# Patient Record
Sex: Female | Born: 1980 | Race: Black or African American | Hispanic: No | Marital: Single | Smoking: Never smoker
Health system: Southern US, Community
[De-identification: ages and names within clinical notes are randomized; demographics above are authoritative.]

## PROBLEM LIST (undated history)

## (undated) DIAGNOSIS — E669 Obesity, unspecified: Secondary | ICD-10-CM

## (undated) DIAGNOSIS — Z789 Other specified health status: Secondary | ICD-10-CM

## (undated) HISTORY — PX: DILATION AND CURETTAGE OF UTERUS: SHX78

## (undated) HISTORY — DX: Obesity, unspecified: E66.9

## (undated) HISTORY — PX: NO PAST SURGERIES: SHX2092

---

## 2009-09-10 ENCOUNTER — Emergency Department (HOSPITAL_COMMUNITY): Admission: EM | Admit: 2009-09-10 | Discharge: 2009-09-10 | Payer: Self-pay | Admitting: Emergency Medicine

## 2010-08-24 LAB — URINALYSIS, ROUTINE W REFLEX MICROSCOPIC
Glucose, UA: NEGATIVE mg/dL
Hgb urine dipstick: NEGATIVE
Protein, ur: NEGATIVE mg/dL
Specific Gravity, Urine: 1.023 (ref 1.005–1.030)
pH: 7 (ref 5.0–8.0)

## 2010-08-24 LAB — WET PREP, GENITAL: Yeast Wet Prep HPF POC: NONE SEEN

## 2010-08-24 LAB — GC/CHLAMYDIA PROBE AMP, GENITAL: GC Probe Amp, Genital: NEGATIVE

## 2010-12-27 ENCOUNTER — Emergency Department (HOSPITAL_COMMUNITY)
Admission: EM | Admit: 2010-12-27 | Discharge: 2010-12-27 | Disposition: A | Discharge status: Home / Self Care | Payer: Medicaid Other | Attending: Emergency Medicine | Admitting: Emergency Medicine

## 2010-12-27 DIAGNOSIS — R358 Other polyuria: Secondary | ICD-10-CM | POA: Insufficient documentation

## 2010-12-27 DIAGNOSIS — Z3201 Encounter for pregnancy test, result positive: Secondary | ICD-10-CM | POA: Insufficient documentation

## 2010-12-27 DIAGNOSIS — O210 Mild hyperemesis gravidarum: Secondary | ICD-10-CM | POA: Insufficient documentation

## 2010-12-27 DIAGNOSIS — R3589 Other polyuria: Secondary | ICD-10-CM | POA: Insufficient documentation

## 2010-12-27 LAB — URINALYSIS, ROUTINE W REFLEX MICROSCOPIC
Bilirubin Urine: NEGATIVE
Glucose, UA: NEGATIVE mg/dL
Nitrite: NEGATIVE
Specific Gravity, Urine: 1.034 — ABNORMAL HIGH (ref 1.005–1.030)
pH: 5.5 (ref 5.0–8.0)

## 2010-12-27 LAB — POCT PREGNANCY, URINE: Preg Test, Ur: POSITIVE

## 2011-01-03 ENCOUNTER — Encounter (HOSPITAL_COMMUNITY): Payer: Self-pay

## 2011-03-03 LAB — HEPATITIS B SURFACE ANTIGEN: Hepatitis B Surface Ag: NEGATIVE

## 2011-03-03 LAB — RUBELLA ANTIBODY, IGM: Rubella: IMMUNE

## 2011-03-03 LAB — HIV ANTIBODY (ROUTINE TESTING W REFLEX): HIV: NONREACTIVE

## 2011-03-03 LAB — ABO/RH: RH Type: POSITIVE

## 2011-04-21 ENCOUNTER — Other Ambulatory Visit (HOSPITAL_COMMUNITY): Payer: Self-pay | Admitting: Obstetrics and Gynecology

## 2011-04-21 DIAGNOSIS — Z0489 Encounter for examination and observation for other specified reasons: Secondary | ICD-10-CM

## 2011-05-02 ENCOUNTER — Other Ambulatory Visit (HOSPITAL_COMMUNITY): Payer: Self-pay

## 2011-05-03 ENCOUNTER — Other Ambulatory Visit (HOSPITAL_COMMUNITY): Payer: Self-pay | Admitting: Obstetrics and Gynecology

## 2011-05-03 ENCOUNTER — Ambulatory Visit (HOSPITAL_COMMUNITY)
Admission: RE | Admit: 2011-05-03 | Discharge: 2011-05-03 | Disposition: A | Discharge status: Home / Self Care | Payer: Medicaid Other | Source: Ambulatory Visit | Attending: Obstetrics and Gynecology | Admitting: Obstetrics and Gynecology

## 2011-05-03 ENCOUNTER — Encounter (HOSPITAL_COMMUNITY): Payer: Self-pay

## 2011-05-03 DIAGNOSIS — E669 Obesity, unspecified: Secondary | ICD-10-CM | POA: Insufficient documentation

## 2011-05-03 DIAGNOSIS — Z09 Encounter for follow-up examination after completed treatment for conditions other than malignant neoplasm: Secondary | ICD-10-CM

## 2011-05-03 DIAGNOSIS — O358XX0 Maternal care for other (suspected) fetal abnormality and damage, not applicable or unspecified: Secondary | ICD-10-CM | POA: Insufficient documentation

## 2011-05-03 DIAGNOSIS — Z363 Encounter for antenatal screening for malformations: Secondary | ICD-10-CM | POA: Insufficient documentation

## 2011-05-03 DIAGNOSIS — Z0489 Encounter for examination and observation for other specified reasons: Secondary | ICD-10-CM

## 2011-05-03 DIAGNOSIS — Z1389 Encounter for screening for other disorder: Secondary | ICD-10-CM | POA: Insufficient documentation

## 2011-05-09 ENCOUNTER — Other Ambulatory Visit: Payer: Self-pay

## 2011-06-06 NOTE — L&D Delivery Note (Signed)
Delivery Note At 8:46 AM a viable female was delivered via Vaginal, Spontaneous Delivery (Presentation: Left Occiput Anterior).  APGAR: 8, 9; weight 5 lb 10.2 oz (2557 g).   Placenta status: Intact, Spontaneous.  Cord: 3 vessels with the following complications: None, routine cord blood collected  Anesthesia: Epidural  Episiotomy: None Lacerations: None Suture Repair: none Est. Blood Loss (mL): 150  Mom to postpartum.  Baby to nursery-stable Dr Estanislado Pandy notified.  Emmely Bittinger M 07/10/2011, 9:08 AM

## 2011-06-08 ENCOUNTER — Ambulatory Visit (HOSPITAL_COMMUNITY)
Admission: RE | Admit: 2011-06-08 | Discharge: 2011-06-08 | Disposition: A | Discharge status: Home / Self Care | Payer: Medicaid Other | Source: Ambulatory Visit | Attending: Obstetrics and Gynecology | Admitting: Obstetrics and Gynecology

## 2011-06-08 DIAGNOSIS — Z3689 Encounter for other specified antenatal screening: Secondary | ICD-10-CM | POA: Insufficient documentation

## 2011-06-08 DIAGNOSIS — Z0489 Encounter for examination and observation for other specified reasons: Secondary | ICD-10-CM

## 2011-06-08 DIAGNOSIS — E669 Obesity, unspecified: Secondary | ICD-10-CM | POA: Insufficient documentation

## 2011-06-08 NOTE — Progress Notes (Signed)
Obstetric ultrasound performed today.  Please see report in [facility]. 

## 2011-06-27 ENCOUNTER — Encounter (HOSPITAL_COMMUNITY): Payer: Self-pay

## 2011-06-27 ENCOUNTER — Inpatient Hospital Stay (HOSPITAL_COMMUNITY)
Admission: AD | Admit: 2011-06-27 | Discharge: 2011-06-27 | Disposition: A | Discharge status: Home / Self Care | Payer: Medicaid Other | Source: Ambulatory Visit | Attending: Obstetrics and Gynecology | Admitting: Obstetrics and Gynecology

## 2011-06-27 DIAGNOSIS — O99891 Other specified diseases and conditions complicating pregnancy: Secondary | ICD-10-CM | POA: Insufficient documentation

## 2011-06-27 DIAGNOSIS — E669 Obesity, unspecified: Secondary | ICD-10-CM | POA: Insufficient documentation

## 2011-06-27 HISTORY — DX: Other specified health status: Z78.9

## 2011-06-27 NOTE — ED Provider Notes (Signed)
History   Meredith Carr is a 31y.o. Morbidly obese black female who presents from office for "amnisure" test.  She was at office yesterday for routine visit, and while there felt a "gush" of fluid that saturated her underwear, and since then, has had intermittent trickling.  Has also been having recent lower abdominal cramping.  Rupture r/o at office yesterday was equivocal, so returned today for u/s and AFI wnl, but dr. Estanislado Pandy sent pt over to MAU for amnisure.  Pt reports GFM.  Has been followed at [facility] for anatomy and interval growth u/s secondary to poor visualization of fetal heart views and overall scanning ability secondary to maternal body habitus.  Pt w/o other c/o's today.  Denies VB, UIT, or PIH s/s.  At last [facility]FM u/s 06/08/11, EFW=3lb 15 oz (34%).    Pregnancy r/f:  1.  Late to Care  2.  H/o polyhydramnios last pregnancy  3.  Morbidly obese    No chief complaint on file.  HPI  OB History    Grav Para Term Preterm Abortions TAB SAB Ect Mult Living   3 1 1  0 1 0 1 0 0 1      Past Medical History  Diagnosis Date  . No pertinent past medical history     Past Surgical History  Procedure Date  . No past surgeries     Family History  Problem Relation Age of Onset  . Anesthesia problems Neg Hx   . Diabetes Father   . Hypertension Father     History  Substance Use Topics  . Smoking status: Never Smoker   . Smokeless tobacco: Not on file  . Alcohol Use: No    Allergies: No Known Allergies  Prescriptions prior to admission  Medication Sig Dispense Refill  . Prenatal Vit-Fe Fumarate-FA (PRENATAL MULTIVITAMIN) TABS Take 1 tablet by mouth daily.        ROS--see history above Physical Exam   Blood pressure 120/55, pulse 84, temperature 98.2 F (36.8 C), temperature source Oral, resp. rate 20, height 5' 6.5" (1.689 m), weight 145.661 kg (321 lb 2 oz), last menstrual period 11/03/2010. EFM:  135, reactive, no decels, moderate variability TOCO: rare ctx .Marland Kitchen Filed Vitals:   06/27/11 1119  BP: 120/55  Pulse: 84  Temp: 98.2 F (36.8 C)  TempSrc: Oral  Resp: 20  Height: 5' 6.5" (1.689 m)  Weight: 145.661 kg (321 lb 2 oz)   Results for orders placed during the hospital encounter of 06/27/11 (from the past 24 hour(s))  AMNISURE RUPTURE OF MEMBRANE (ROM)     Status: Normal   Collection Time   06/27/11 11:35 AM      Component Value Range   Amnisure ROM NEGATIVE     Physical Exam  Constitutional: She is oriented to person, place, and time. She appears well-developed and well-nourished. No distress.  Cardiovascular: Normal rate.   Respiratory: Effort normal.  GI: Soft.       gravid  Genitourinary:       cx exam deferred  Neurological: She is alert and oriented to person, place, and time.  Skin: Skin is warm and dry.    MAU Course  Procedures 1.  NST 2.  amnisure  Assessment and Plan  1.  IUP at 35.5 2.  No s/s of ROM or PTL 3.  Morbidly obese 4.  Reactive FHT  1.  D/c home w/ PTL precautions and FKC 2.  Keep appt w/ MFM this Friday 1/25, and f/u at [facility]  as scheduled 3.  F/u prn any further concerns r/e LOF, labor, etc.  Airianna Kreischer H 06/27/2011, 12:23 PM

## 2011-06-27 NOTE — Progress Notes (Signed)
Pt states, " Yesterday at 10:00 am, I was at the doctors office and had a gush of water that wet my panties. They had one positive test and one negative test. Ever since then it keeps trickling. I've had cramping for two days."

## 2011-06-30 ENCOUNTER — Ambulatory Visit (HOSPITAL_COMMUNITY)
Admission: RE | Admit: 2011-06-30 | Discharge: 2011-06-30 | Disposition: A | Discharge status: Home / Self Care | Payer: Medicaid Other | Source: Ambulatory Visit | Attending: Obstetrics and Gynecology | Admitting: Obstetrics and Gynecology

## 2011-06-30 VITALS — BP 116/63 | HR 68 | Wt 321.0 lb

## 2011-06-30 DIAGNOSIS — O99210 Obesity complicating pregnancy, unspecified trimester: Secondary | ICD-10-CM | POA: Insufficient documentation

## 2011-06-30 DIAGNOSIS — Z3689 Encounter for other specified antenatal screening: Secondary | ICD-10-CM | POA: Insufficient documentation

## 2011-06-30 DIAGNOSIS — E669 Obesity, unspecified: Secondary | ICD-10-CM | POA: Insufficient documentation

## 2011-06-30 NOTE — Progress Notes (Signed)
Meredith Carr was seen for ultrasound appointment today.  Please see [facility] report for details.  

## 2011-07-09 ENCOUNTER — Inpatient Hospital Stay (HOSPITAL_COMMUNITY)
Admission: AD | Admit: 2011-07-09 | Discharge: 2011-07-12 | DRG: 775 | Disposition: A | Discharge status: Home / Self Care | Payer: Medicaid Other | Source: Ambulatory Visit | Attending: Obstetrics and Gynecology | Admitting: Obstetrics and Gynecology

## 2011-07-09 ENCOUNTER — Encounter (HOSPITAL_COMMUNITY): Payer: Self-pay | Admitting: *Deleted

## 2011-07-09 DIAGNOSIS — IMO0002 Reserved for concepts with insufficient information to code with codable children: Secondary | ICD-10-CM

## 2011-07-09 DIAGNOSIS — E669 Obesity, unspecified: Principal | ICD-10-CM | POA: Diagnosis present

## 2011-07-09 DIAGNOSIS — Z3490 Encounter for supervision of normal pregnancy, unspecified, unspecified trimester: Secondary | ICD-10-CM

## 2011-07-09 DIAGNOSIS — O99214 Obesity complicating childbirth: Principal | ICD-10-CM | POA: Diagnosis present

## 2011-07-09 LAB — AMNISURE RUPTURE OF MEMBRANE (ROM) NOT AT ARMC: Amnisure ROM: POSITIVE

## 2011-07-09 LAB — CBC
HCT: 36.3 % (ref 36.0–46.0)
MCV: 98.4 fL (ref 78.0–100.0)
RBC: 3.69 MIL/uL — ABNORMAL LOW (ref 3.87–5.11)
WBC: 11.8 10*3/uL — ABNORMAL HIGH (ref 4.0–10.5)

## 2011-07-09 MED ORDER — ONDANSETRON HCL 4 MG/2ML IJ SOLN: 4.0000 mg | Freq: Four times a day (QID) | INTRAMUSCULAR | Status: DC | PRN

## 2011-07-09 MED ORDER — SODIUM CHLORIDE 0.9 % IV SOLN: 250.0000 mL | INTRAVENOUS | Status: DC | PRN

## 2011-07-09 MED ORDER — OXYTOCIN BOLUS FROM INFUSION
500.0000 mL | Freq: Once | INTRAVENOUS | Status: DC
  Filled 2011-07-09: qty 500
  Filled 2011-07-09: qty 1000

## 2011-07-09 MED ORDER — PROMETHAZINE HCL 25 MG/ML IJ SOLN: 12.5000 mg | INTRAMUSCULAR | Status: DC | PRN

## 2011-07-09 MED ORDER — OXYTOCIN 20 UNITS IN LACTATED RINGERS INFUSION - SIMPLE
1.0000 m[IU]/min | INTRAVENOUS | Status: DC
  Administered 2011-07-09: 1 m[IU]/min via INTRAVENOUS

## 2011-07-09 MED ORDER — SODIUM CHLORIDE 0.9 % IJ SOLN: 3.0000 mL | INTRAMUSCULAR | Status: DC | PRN

## 2011-07-09 MED ORDER — BUTORPHANOL TARTRATE 2 MG/ML IJ SOLN: 1.0000 mg | INTRAMUSCULAR | Status: DC | PRN

## 2011-07-09 MED ORDER — ACETAMINOPHEN 325 MG PO TABS: 650.0000 mg | ORAL_TABLET | ORAL | Status: DC | PRN

## 2011-07-09 MED ORDER — OXYCODONE-ACETAMINOPHEN 5-325 MG PO TABS: 1.0000 | ORAL_TABLET | ORAL | Status: DC | PRN

## 2011-07-09 MED ORDER — LACTATED RINGERS IV SOLN
500.0000 mL | INTRAVENOUS | Status: DC | PRN
  Administered 2011-07-10: 500 mL via INTRAVENOUS

## 2011-07-09 MED ORDER — SODIUM CHLORIDE 0.9 % IJ SOLN: 3.0000 mL | Freq: Two times a day (BID) | INTRAMUSCULAR | Status: DC

## 2011-07-09 MED ORDER — LIDOCAINE HCL (PF) 1 % IJ SOLN
30.0000 mL | INTRAMUSCULAR | Status: DC | PRN
  Filled 2011-07-09: qty 30

## 2011-07-09 MED ORDER — TERBUTALINE SULFATE 1 MG/ML IJ SOLN: 0.2500 mg | Freq: Once | INTRAMUSCULAR | Status: AC | PRN

## 2011-07-09 MED ORDER — FLEET ENEMA 7-19 GM/118ML RE ENEM: 1.0000 | ENEMA | RECTAL | Status: DC | PRN

## 2011-07-09 MED ORDER — OXYTOCIN 20 UNITS IN LACTATED RINGERS INFUSION - SIMPLE
125.0000 mL/h | Freq: Once | INTRAVENOUS | Status: AC
  Administered 2011-07-10: 999 mL/h via INTRAVENOUS

## 2011-07-09 MED ORDER — IBUPROFEN 600 MG PO TABS: 600.0000 mg | ORAL_TABLET | Freq: Four times a day (QID) | ORAL | Status: DC | PRN

## 2011-07-09 MED ORDER — CITRIC ACID-SODIUM CITRATE 334-500 MG/5ML PO SOLN: 30.0000 mL | ORAL | Status: DC | PRN

## 2011-07-09 MED ORDER — BUTORPHANOL TARTRATE 2 MG/ML IJ SOLN
2.0000 mg | INTRAMUSCULAR | Status: DC | PRN
  Administered 2011-07-10: 2 mg via INTRAVENOUS
  Filled 2011-07-09: qty 1

## 2011-07-09 NOTE — Progress Notes (Signed)
Pt states she felt a gush when she was lying down an hour ago and continues to feel slight leaking of something along with a large amt of mucous.  No bleeding or contractions.

## 2011-07-09 NOTE — ED Provider Notes (Signed)
History    31 yo G3P1011 at 37 2/7 weeks presented unannounced c/o gush of clear fluid approx 1 hour ago.  Denies contractions, reports +FM.  No recent IC.  Cervix was 1-2 cm last week.  Chief Complaint  Patient presents with  . Rupture of Membranes   Pregnancy remarkable for: Late to care at 17 weeks Hx polyhydramnios Morbid obesity GBS negative  OB History    Grav Para Term Preterm Abortions TAB SAB Ect Mult Living   3 1 1  0 1 0 1 0 0 1    #1--2002 SVB Female 6 obs 38 weeks 6 hour labor with epidural.  Induced for polyhydramnios in [place] #2--2005 SAB at 4 weeks, D&C. #3--Current  Past Medical History  Diagnosis Date  . No pertinent past medical history     Past Surgical History  Procedure Date  . No past surgeries   D&C 2005  Family History  Problem Relation Age of Onset  . Anesthesia problems Neg Hx   . Diabetes Father   . Hypertension Father     History  Substance Use Topics  . Smoking status: Never Smoker   . Smokeless tobacco: Not on file  . 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     Allergies: No Known Allergies  Prescriptions prior to admission  Medication Sig Dispense Refill  . Prenatal Vit-Fe Fumarate-FA (PRENATAL MULTIVITAMIN) TABS Take 1 tablet by mouth daily.        ROS:  Leaking clear fluid, unaware of contractions.  + FM.  No HA, visual symptoms, epigastric pain. Physical Exam   Blood pressure 125/56, pulse 81, temperature 98 F (36.7 C), temperature source Oral, resp. rate 18, height 5' 6.5" (1.689 m), weight 146.33 kg (322 lb 9.6 oz), last menstrual period 11/03/2010.  Chest clear Heart RRR without murmur Abd gravid, significant pannus/adipose tissue, NT Pelvic--leaking clear fluid,  Cervix 1-2 cm, 60%, vtx -2.  FHR reactive, no decels. Very  occasional mild contractions.  Amnisure positive  ED Course  IUP at 37 2/7 weeks SROM, no labor GBS negative Category 1 FHR  Plan: Admit to Birthing Suite per consult with Dr. Su Hilt Routine CNM orders Observe at present, augment prn if no labor.   Nigel Bridgeman, CNM, [place] 07/09/11 5:45p

## 2011-07-09 NOTE — Progress Notes (Signed)
Denies painful or frequent contractions, argees to pitocin O VSS      Fhts 120-130s LTV mod accels      uc q 6 mild      Vag not assessed       [place] presentation VTX bedside A 37 2/7 IUP     SROM at 1630 P IV pitocin now. Lavera Guise, CNM

## 2011-07-09 NOTE — Progress Notes (Signed)
Pt reports she has been wearing a pad for 2 weeks (she was evaluated 2 weeks ago for SROM) and It was very wet today. Reports a large amount of mucusy discharge has come out. Pt reports mild occasional contractions and good fetal movement.

## 2011-07-09 NOTE — Plan of Care (Signed)
Problem: Consults Goal: Birthing Suites Patient Information Press F2 to bring up selections list Outcome: Completed/Met Date Met:  07/09/11  Pt 37-[redacted] weeks EGA     

## 2011-07-09 NOTE — H&P (Signed)
31 yo G3P1011 at 37 2/7 weeks presented unannounced c/o gush of clear fluid approx 1 hour ago.  Denies contractions, reports +FM.  No recent IC.  Cervix was 1-2 cm last week.  Chief Complaint  Patient presents with  . Rupture of Membranes   Pregnancy remarkable for: Late to care at 17 weeks Hx polyhydramnios Morbid obesity GBS negative   History of present pregnancy: Patient entered care at 17 weeks.  EDC of 07/28/11 was established by 21 weeks.  Anatomy scan was done at 21  weeks, with limited anatomy due to body habitus.  Further ultrasounds were done at 23 weeks, 24 weeks, 35 weeks, and 36 weeks--still limited anatomy of heart due to body habitus.  Her prenatal course was essentially uncomplicated.  Further signficant events were normal glucola, GBS negative, and normal fluid at term. At her last evalution, she was 1-2 cm.  OB History    Grav Para Term Preterm Abortions TAB SAB Ect Mult Living   3 1 1  0 1 0 1 0 0 1    #1--2002 SVB Female 6 obs 38 weeks 6 hour labor with epidural.  Induced for polyhydramnios in [place] #2--2005 SAB at 4 weeks, D&C. #3--Current  Past Medical History  Diagnosis Date  . No pertinent past medical history     Past Surgical History  Procedure Date  . No past surgeries   D&C 2005  Family History  Problem Relation Age of Onset  . Anesthesia problems Neg Hx   . Diabetes Father   . Hypertension Father     History  Substance Use Topics  . Smoking status: Never Smoker   . Smokeless tobacco: Not on file  . 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     Allergies: No Known Allergies  Prescriptions prior to admission  Medication Sig Dispense Refill  . Prenatal Vit-Fe Fumarate-FA (PRENATAL MULTIVITAMIN) TABS Take 1 tablet by mouth daily.        ROS:  Leaking  clear fluid, unaware of contractions.  + FM.  No HA, visual symptoms, epigastric pain  Prenatal Labs. B+ Neg antibody screen Sickle cell/Hgb electrophoresis negative RPR NR HIV NR HBsAg Negative Declined genetic screens Glucola WNL Hgb at [facility] 12.7/12 at 28 weeks  Physical Exam   Blood pressure 125/56, pulse 81, temperature 98 F (36.7 C), temperature source Oral, resp. rate 18, height 5' 6.5" (1.689 m), weight 146.33 kg (322 lb 9.6 oz), last menstrual period 11/03/2010.  Chest clear Heart RRR without murmur Abd gravid, significant pannus/adipose tissue, NT Pelvic--leaking clear fluid,  Cervix 1-2 cm, 60%, vtx -2.  FHR reactive, no decels. Very occasional mild contractions.  Amnisure positive  ED Course  IUP at 37 2/7 weeks SROM, no labor GBS negative Category 1 FHR  Plan: Admit to Birthing Suite per consult with Dr. Su Hilt Routine CNM orders Observe at present, augment prn if no labor.  Nigel Bridgeman, CNM, [place] 07/09/11 6pm

## 2011-07-10 ENCOUNTER — Inpatient Hospital Stay (HOSPITAL_COMMUNITY): Payer: Medicaid Other | Admitting: Anesthesiology

## 2011-07-10 ENCOUNTER — Encounter (HOSPITAL_COMMUNITY): Payer: Self-pay | Admitting: Anesthesiology

## 2011-07-10 ENCOUNTER — Encounter (HOSPITAL_COMMUNITY): Payer: Self-pay | Admitting: *Deleted

## 2011-07-10 LAB — DIFFERENTIAL
Basophils Absolute: 0 10*3/uL (ref 0.0–0.1)
Basophils Relative: 0 % (ref 0–1)
Eosinophils Absolute: 0.5 10*3/uL (ref 0.0–0.7)
Eosinophils Relative: 5 % (ref 0–5)

## 2011-07-10 MED ORDER — SENNOSIDES-DOCUSATE SODIUM 8.6-50 MG PO TABS
2.0000 | ORAL_TABLET | Freq: Every day | ORAL | Status: DC
  Administered 2011-07-10 – 2011-07-11 (×2): 2 via ORAL

## 2011-07-10 MED ORDER — FENTANYL 2.5 MCG/ML BUPIVACAINE 1/10 % EPIDURAL INFUSION (WH - ANES)
14.0000 mL/h | INTRAMUSCULAR | Status: DC
  Administered 2011-07-10: 14 mL/h via EPIDURAL
  Filled 2011-07-10 (×2): qty 60

## 2011-07-10 MED ORDER — LACTATED RINGERS IV SOLN
INTRAVENOUS | Status: DC
  Administered 2011-07-10: 05:00:00 via INTRAVENOUS

## 2011-07-10 MED ORDER — OXYCODONE-ACETAMINOPHEN 5-325 MG PO TABS
1.0000 | ORAL_TABLET | ORAL | Status: DC | PRN
  Filled 2011-07-10: qty 1

## 2011-07-10 MED ORDER — IBUPROFEN 600 MG PO TABS
600.0000 mg | ORAL_TABLET | Freq: Four times a day (QID) | ORAL | Status: DC
  Administered 2011-07-10 – 2011-07-12 (×8): 600 mg via ORAL
  Filled 2011-07-10 (×9): qty 1

## 2011-07-10 MED ORDER — EPHEDRINE 5 MG/ML INJ
10.0000 mg | INTRAVENOUS | Status: AC | PRN
  Administered 2011-07-10 (×2): 10 mg via INTRAVENOUS
  Filled 2011-07-10: qty 4

## 2011-07-10 MED ORDER — MEASLES, MUMPS & RUBELLA VAC ~~LOC~~ INJ
0.5000 mL | INJECTION | Freq: Once | SUBCUTANEOUS | Status: DC
  Filled 2011-07-10: qty 0.5

## 2011-07-10 MED ORDER — TETANUS-DIPHTH-ACELL PERTUSSIS 5-2.5-18.5 LF-MCG/0.5 IM SUSP: 0.5000 mL | Freq: Once | INTRAMUSCULAR | Status: DC

## 2011-07-10 MED ORDER — ONDANSETRON HCL 4 MG PO TABS: 4.0000 mg | ORAL_TABLET | ORAL | Status: DC | PRN

## 2011-07-10 MED ORDER — SODIUM BICARBONATE 8.4 % IV SOLN
INTRAVENOUS | Status: DC | PRN
  Administered 2011-07-10: 4 mL via EPIDURAL

## 2011-07-10 MED ORDER — PHENYLEPHRINE 40 MCG/ML (10ML) SYRINGE FOR IV PUSH (FOR BLOOD PRESSURE SUPPORT)
80.0000 ug | PREFILLED_SYRINGE | INTRAVENOUS | Status: DC | PRN
  Filled 2011-07-10: qty 5

## 2011-07-10 MED ORDER — LACTATED RINGERS IV SOLN
500.0000 mL | Freq: Once | INTRAVENOUS | Status: AC
  Administered 2011-07-10: 500 mL via INTRAVENOUS

## 2011-07-10 MED ORDER — EPHEDRINE 5 MG/ML INJ: 10.0000 mg | INTRAVENOUS | Status: DC | PRN

## 2011-07-10 MED ORDER — SIMETHICONE 80 MG PO CHEW: 80.0000 mg | CHEWABLE_TABLET | ORAL | Status: DC | PRN

## 2011-07-10 MED ORDER — ONDANSETRON HCL 4 MG/2ML IJ SOLN: 4.0000 mg | INTRAMUSCULAR | Status: DC | PRN

## 2011-07-10 MED ORDER — PRENATAL MULTIVITAMIN CH
1.0000 | ORAL_TABLET | Freq: Every day | ORAL | Status: DC
  Administered 2011-07-12: 1 via ORAL
  Filled 2011-07-10: qty 1

## 2011-07-10 MED ORDER — DIPHENHYDRAMINE HCL 50 MG/ML IJ SOLN: 12.5000 mg | INTRAMUSCULAR | Status: DC | PRN

## 2011-07-10 MED ORDER — ZOLPIDEM TARTRATE 5 MG PO TABS: 5.0000 mg | ORAL_TABLET | Freq: Every evening | ORAL | Status: DC | PRN

## 2011-07-10 MED ORDER — WITCH HAZEL-GLYCERIN EX PADS: 1.0000 "application " | MEDICATED_PAD | CUTANEOUS | Status: DC | PRN

## 2011-07-10 MED ORDER — DIPHENHYDRAMINE HCL 25 MG PO CAPS: 25.0000 mg | ORAL_CAPSULE | Freq: Four times a day (QID) | ORAL | Status: DC | PRN

## 2011-07-10 MED ORDER — FENTANYL 2.5 MCG/ML BUPIVACAINE 1/10 % EPIDURAL INFUSION (WH - ANES)
INTRAMUSCULAR | Status: DC | PRN
  Administered 2011-07-10: 14 mL/h via EPIDURAL

## 2011-07-10 MED ORDER — DIBUCAINE 1 % RE OINT: 1.0000 "application " | TOPICAL_OINTMENT | RECTAL | Status: DC | PRN

## 2011-07-10 MED ORDER — PHENYLEPHRINE 40 MCG/ML (10ML) SYRINGE FOR IV PUSH (FOR BLOOD PRESSURE SUPPORT): 80.0000 ug | PREFILLED_SYRINGE | INTRAVENOUS | Status: DC | PRN

## 2011-07-10 MED ORDER — LANOLIN HYDROUS EX OINT: TOPICAL_OINTMENT | CUTANEOUS | Status: DC | PRN

## 2011-07-10 MED ORDER — BENZOCAINE-MENTHOL 20-0.5 % EX AERO: 1.0000 "application " | INHALATION_SPRAY | CUTANEOUS | Status: DC | PRN

## 2011-07-10 NOTE — Progress Notes (Signed)
Patient ID: Meredith Carr, female   DOB: [birth year], 31 y.o.   MRN: [id number] .Subjective: Feels ctx "in my butt" denies urge to push Was going to place IUPC, but now 9+cm   Objective: BP 107/75  Pulse 92  Temp(Src) 97.9 F (36.6 C) (Oral)  Resp 18  Ht 5' 6.5" (1.689 m)  Wt 146.058 kg (322 lb)  BMI 51.19 kg/m2  SpO2 95%  LMP 11/03/2010   FHT:  FHR: 130 bpm, variability: moderate,  accelerations:  Present,  decelerations:  Present variables occ mild UC:   Not tracing SVE:   Dilation: 4 Effacement (%): 90 Station: -2;-1 Exam by:: l.poore, rn   Assessment / Plan: appoaching 2nd stage, will push when pt has urge GBS neg   Fetal Wellbeing:  Category I Pain Control:  Epidural  Update physician PRN  Buddy Loeffelholz M 07/10/2011, 8:02 AM

## 2011-07-10 NOTE — Progress Notes (Signed)
Drowsy, states doing fine O VSS      Fhts 140s LTV min to mod      uc q 2-5 mild to mod      Vag not assessed A 37 3/7 week IUP     SROM x 11 hours P continue care, IV Pitocin at [address] CNM

## 2011-07-10 NOTE — Progress Notes (Signed)
Report to S. Lillard, CNM per telephone given as written at 0600 oncoming shift at 0700. Lavera Guise, CNM

## 2011-07-10 NOTE — Anesthesia Preprocedure Evaluation (Signed)
Anesthesia Evaluation  Patient identified by MRN, date of birth, ID band Patient awake    Reviewed: Allergy & Precautions, H&P , Patient's Chart, lab work & pertinent test results  Airway Mallampati: III TM Distance: >3 FB Neck ROM: full    Dental  (+) Teeth Intact   Pulmonary  clear to auscultation        Cardiovascular regular Normal    Neuro/Psych    GI/Hepatic   Endo/Other  Morbid obesity  Renal/GU      Musculoskeletal   Abdominal   Peds  Hematology   Anesthesia Other Findings       Reproductive/Obstetrics (+) Pregnancy                           Anesthesia Physical Anesthesia Plan  ASA: III  Anesthesia Plan: Epidural   Post-op Pain Management:    Induction:   Airway Management Planned:   Additional Equipment:   Intra-op Plan:   Post-operative Plan:   Informed Consent: I have reviewed the patients History and Physical, chart, labs and discussed the procedure including the risks, benefits and alternatives for the proposed anesthesia with the patient or authorized representative who has indicated his/her understanding and acceptance.   Dental Advisory Given  Plan Discussed with:   Anesthesia Plan Comments: (Labs checked- platelets confirmed with RN in room. Fetal heart tracing, per RN, reported to be stable enough for sitting procedure. Discussed epidural, and patient consents to the procedure:  included risk of possible headache,backache, failed block, allergic reaction, and nerve injury. This patient was asked if she had any questions or concerns before the procedure started. )        Anesthesia Quick Evaluation  

## 2011-07-10 NOTE — Anesthesia Procedure Notes (Signed)
Epidural Patient location during procedure: OB  Preanesthetic Checklist Completed: patient identified, site marked, surgical consent, pre-op evaluation, timeout performed, IV checked, risks and benefits discussed and monitors and equipment checked  Epidural Patient position: sitting Prep: site prepped and draped and DuraPrep Patient monitoring: continuous pulse ox and blood pressure Approach: midline Injection technique: LOR air  Needle:  Needle type: Tuohy  Needle gauge: 17 G Needle length: 9 cm Needle insertion depth: 8 cm Catheter type: closed end flexible Catheter size: 19 Gauge Test dose: negative  Assessment Events: blood not aspirated, injection not painful, no injection resistance, negative IV test and no paresthesia  Additional Notes Dosing of Epidural:  1st dose, through needle ............................................. epi 1:200K + Xylocaine 40 mg  2nd dose, through catheter, after waiting 3 minutes.....epi 1:200K + Xylocaine 40 mg  3rd dose, through catheter after waiting 3 minutes .............................Marcaine   4mg   ( mg Marcaine are expressed as equivilent  cc's medication removed from the 0.1%Bupiv / fentanyl syringe from L&D pump)  ( 2% Xylo charted as a single dose in Epic Meds for ease of charting; actual dosing was fractionated as above, for saftey's sake)  As each dose occurred, patient was free of IV sx; and patient exhibited no evidence of SA injection.  Patient is more comfortable after epidural dosed. Please see RN's note for documentation of vital signs,and FHR which are stable.    

## 2011-07-10 NOTE — Progress Notes (Signed)
Breathing with contractions at times O VSS      Fhts 140s LTV mod accels      abd soft, gravid, nt      uc q 2-6 mild to mod      Vag 3 70-1/2- VTX R clear A 37 3/7 week IUP P continue care,  Lavera Guise, CNM

## 2011-07-10 NOTE — Progress Notes (Signed)
UR chart review completed.  

## 2011-07-10 NOTE — Progress Notes (Signed)
Breathing well with uc, requests epidural O VSS Fhts 120s LTV mod early decels abd soft gravid, nt  uc mod q 3-4 Vag 4 100 -1/-2 VTX R clear  A 37 3/7 week IUP      SROM Pepidural now, continue care  Lavera Guise, CNM

## 2011-07-11 NOTE — Anesthesia Postprocedure Evaluation (Signed)
  Anesthesia Post-op Note  Patient: Meredith Carr  Procedure(s) Performed: * No procedures listed *  Patient Location: PACU and Mother/Baby  Anesthesia Type: Epidural  Level of Consciousness: awake, alert  and oriented  Airway and Oxygen Therapy: Patient Spontanous Breathing  Post-op Pain: none  Post-op Assessment: Post-op Vital signs reviewed  Post-op Vital Signs: Reviewed and stable  Complications: No apparent anesthesia complications

## 2011-07-11 NOTE — Progress Notes (Signed)
Pt refusing am labs this morning. States she does not want them done because she has poor veins. Informed CMN.

## 2011-07-11 NOTE — Progress Notes (Signed)
Post Partum Day 1 Subjective: up ad lib, voiding, tolerating PO, + flatus and BM this morning.  Working on [employer]; breastfed older daughter.  Planning outpt circ.  Pt refused blood draw for CBC this AM--agreeable to draw tomorrow morning--fearful b/c "hard stick." VB lighter this AM.  No dizziness when OOB.  S.o. At bedside and supportive. Objective: Blood pressure 103/55, pulse 83, temperature 98 F (36.7 C), temperature source Axillary, resp. rate 19, height 5' 6.5" (1.689 m), weight 146.058 kg (322 lb), last menstrual period 11/03/2010, SpO2 95.00%, unknown if currently breastfeeding.  Physical Exam:  General: alert, cooperative, no distress and morbidly obese Lochia: appropriate Uterine Fundus: firm, below umbilicus Incision: n/a; no lacerations DVT Evaluation: No evidence of DVT seen on physical exam. Negative Homan's sign. No significant calf/ankle edema.   Basename 07/09/11 1935  HGB 11.8*  HCT 36.3    Assessment/Plan: Plan for discharge tomorrow, Breastfeeding and Lactation consult CBC tomorrow morning.  LOS: 2 days   Nyari Olsson H 07/11/2011, 11:41 AM

## 2011-07-11 NOTE — Addendum Note (Signed)
Addendum  created 07/11/11 4782 by Marrion Coy, CRNA   Modules edited:Charges VN, Notes Section

## 2011-07-12 MED ORDER — IBUPROFEN 600 MG PO TABS: 600.0000 mg | ORAL_TABLET | Freq: Four times a day (QID) | ORAL | Status: AC | PRN

## 2011-07-12 NOTE — Progress Notes (Signed)
Pt pleasantly refusing AM labs again this morning. States she 'does not want them done because she is a hard stick'.

## 2011-07-12 NOTE — Discharge Summary (Signed)
Obstetric Discharge Summary Reason for Admission: onset of labor Prenatal Procedures: ultrasound Intrapartum Procedures: spontaneous vaginal delivery Postpartum Procedures: none Complications-Operative and Postpartum: none Hemoglobin  Date Value Range Status  07/09/2011 11.8* 12.0-15.0 (g/dL) Final     HCT  Date Value Range Status  07/09/2011 36.3  36.0-46.0 (%) Final    Discharge Diagnoses: Term Pregnancy-delivered  Discharge Information: Date: 07/12/2011 Activity: pelvic rest Diet: routine Medications: PNV Condition: stable Instructions: refer to practice specific booklet Discharge to: home Follow-up Information    Follow up with [facility] in 5 weeks. (5 and 6 )        brests filling, ff sm serosa flow, perineum intact -Homan's sign bilaterally, no edema bilaterally  Newborn Data: Live born female  Birth Weight: 5 lb 10.2 oz (2557 g) APGAR: 8, 9  Home with mother.  Hassen Bruun 07/12/2011, 8:41 AM

## 2011-08-21 ENCOUNTER — Ambulatory Visit (INDEPENDENT_AMBULATORY_CARE_PROVIDER_SITE_OTHER): Payer: Medicaid Other | Admitting: Obstetrics and Gynecology

## 2011-08-21 DIAGNOSIS — Z3040 Encounter for surveillance of contraceptives, unspecified: Secondary | ICD-10-CM

## 2011-08-30 ENCOUNTER — Encounter (INDEPENDENT_AMBULATORY_CARE_PROVIDER_SITE_OTHER): Payer: Medicaid Other | Admitting: Obstetrics and Gynecology

## 2011-08-30 DIAGNOSIS — Z30430 Encounter for insertion of intrauterine contraceptive device: Secondary | ICD-10-CM

## 2011-09-26 DIAGNOSIS — E669 Obesity, unspecified: Secondary | ICD-10-CM

## 2011-09-27 ENCOUNTER — Encounter: Payer: Self-pay | Admitting: Obstetrics and Gynecology

## 2011-09-27 ENCOUNTER — Ambulatory Visit (INDEPENDENT_AMBULATORY_CARE_PROVIDER_SITE_OTHER): Payer: Medicaid Other | Admitting: Obstetrics and Gynecology

## 2011-09-27 VITALS — BP 116/74 | HR 72 | Wt 312.5 lb

## 2011-09-27 DIAGNOSIS — Z30431 Encounter for routine checking of intrauterine contraceptive device: Secondary | ICD-10-CM

## 2011-09-27 NOTE — Progress Notes (Signed)
IUD SURVEILLANCE NOTE  MIRENA IUD inserted on 08/30/11  Pain: none Bleeding: minimal Patient has checked IUD strings: yes Other complaints: no  EXAM:  IUD strings visualized: yes              Pelvic exam normal: yes  Next visit with annual exam 9/13   Aprille Sawhney A MD 09/27/2011 9:06 AM

## 2012-02-07 IMAGING — US US OB FOLLOW-UP
1 series · 14 of 28 positions shown · non-contrast
Comparison: none

[Series 1: us ob follow-up · 14 of 40 slices shown]
[im 2/40]
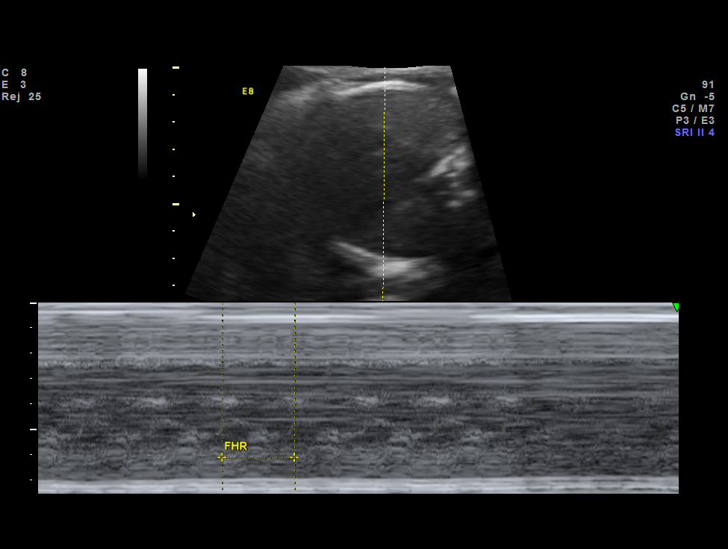
[im 5/40]
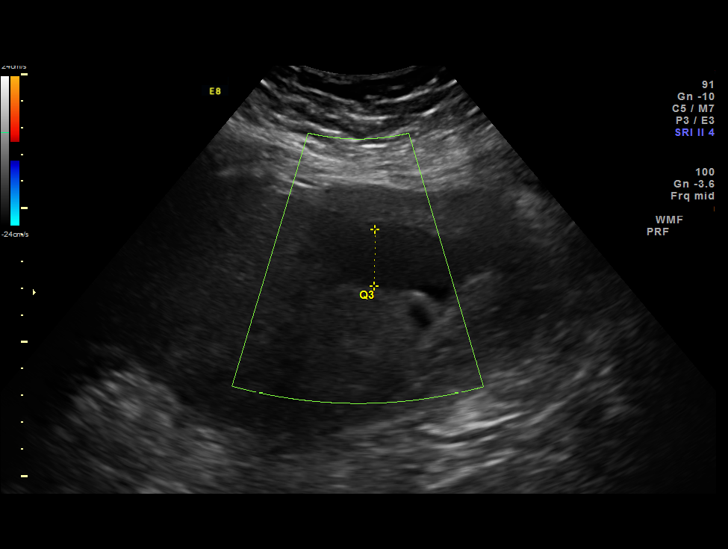
[im 8/40]
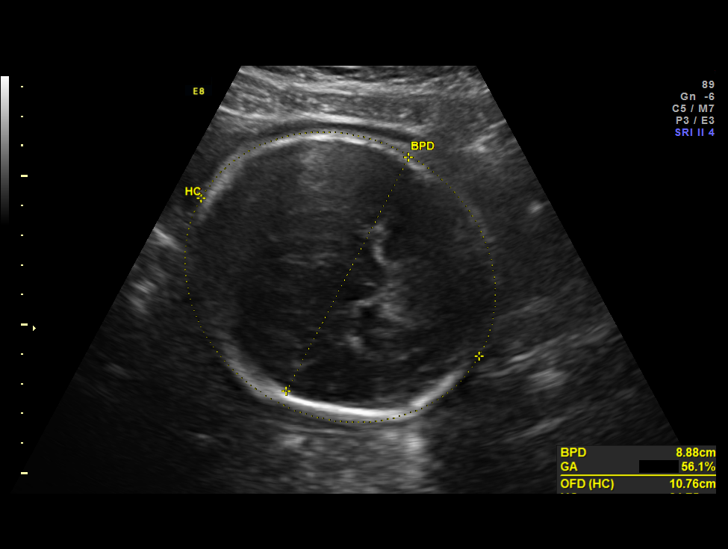
[im 11/40]
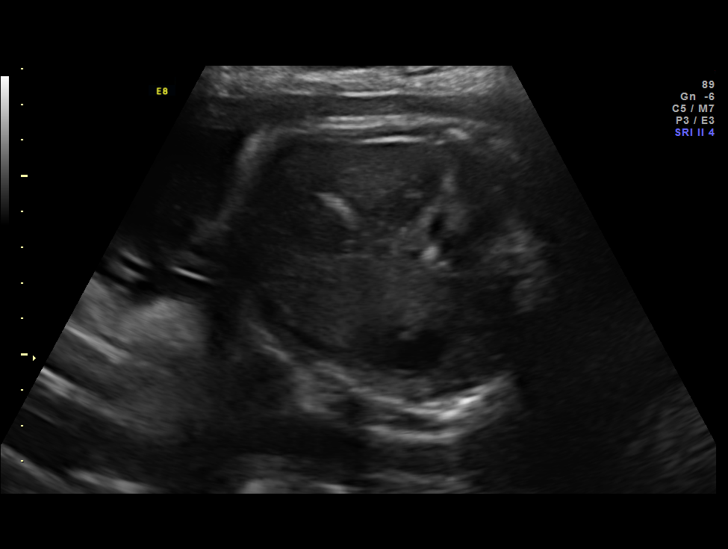
[im 14/40]
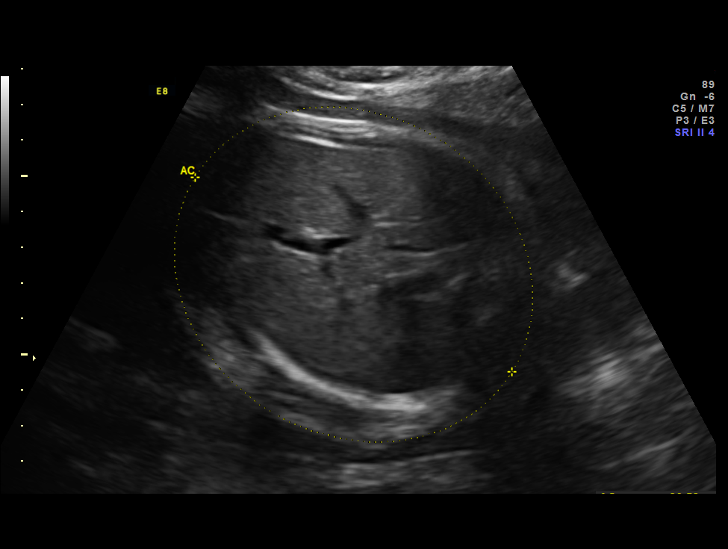
[im 16/40]
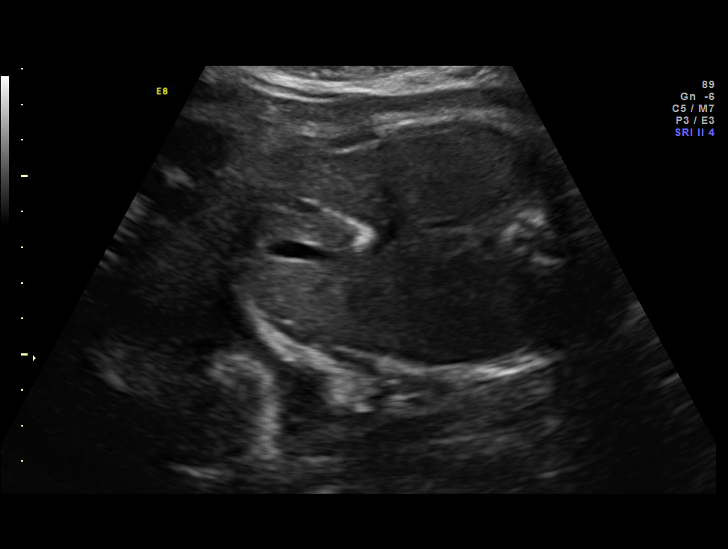
[im 19/40]
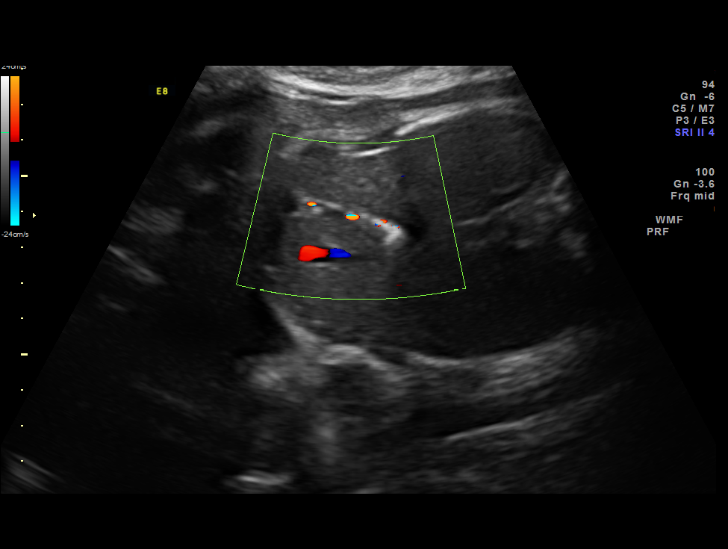
[im 22/40]
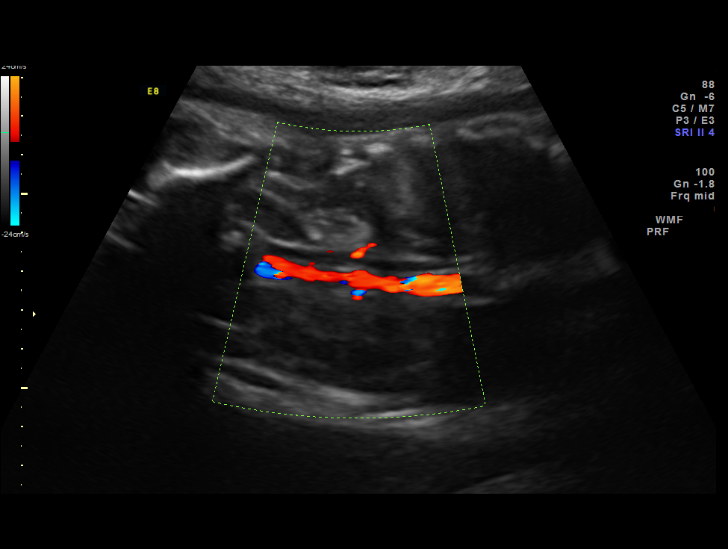
[im 25/40]
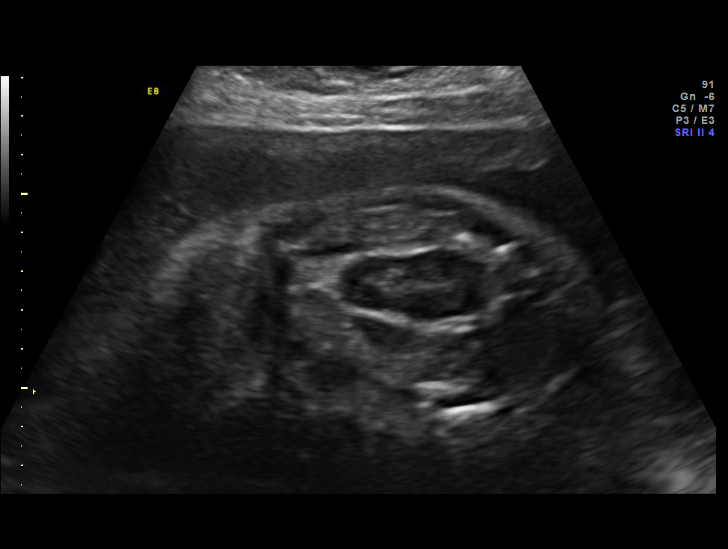
[im 28/40]
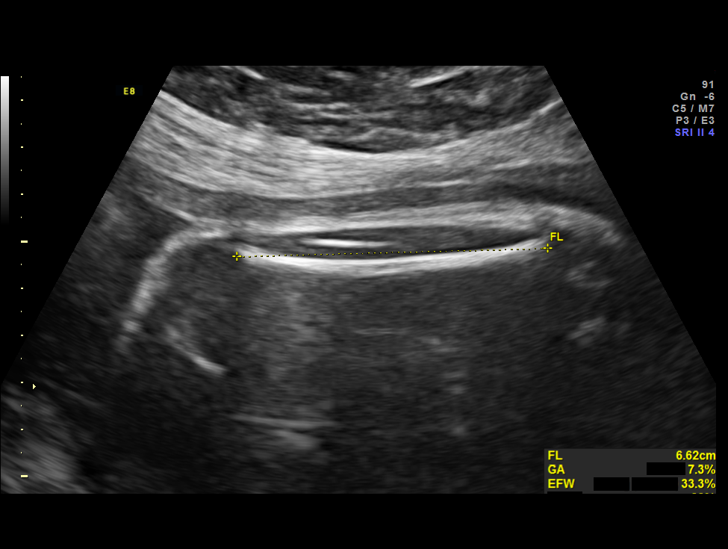
[im 31/40]
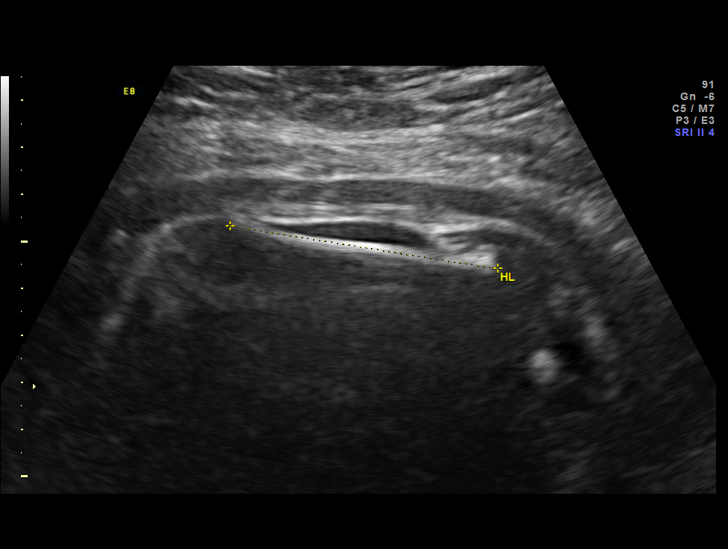
[im 34/40]
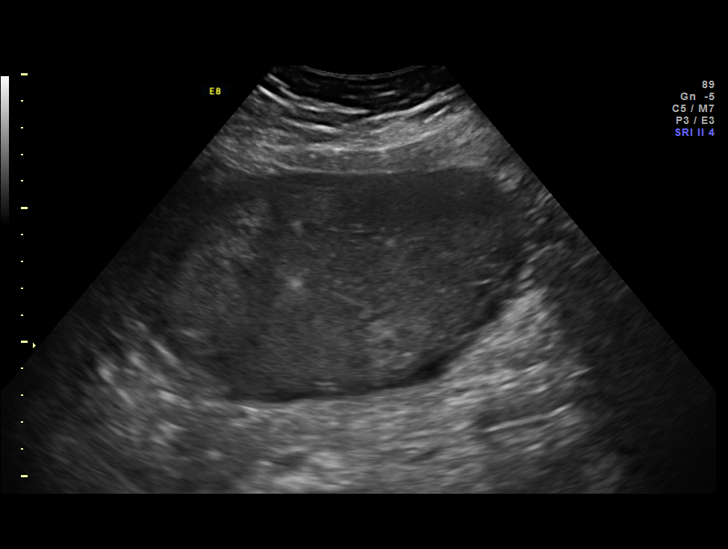
[im 37/40]
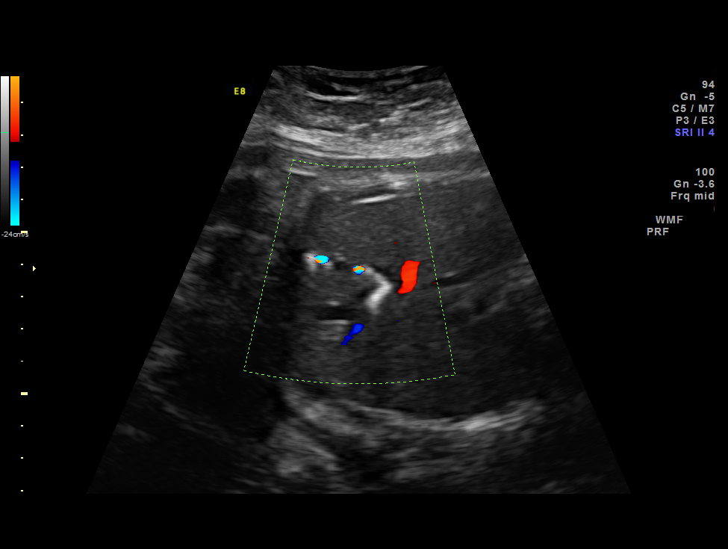
[im 40/40]
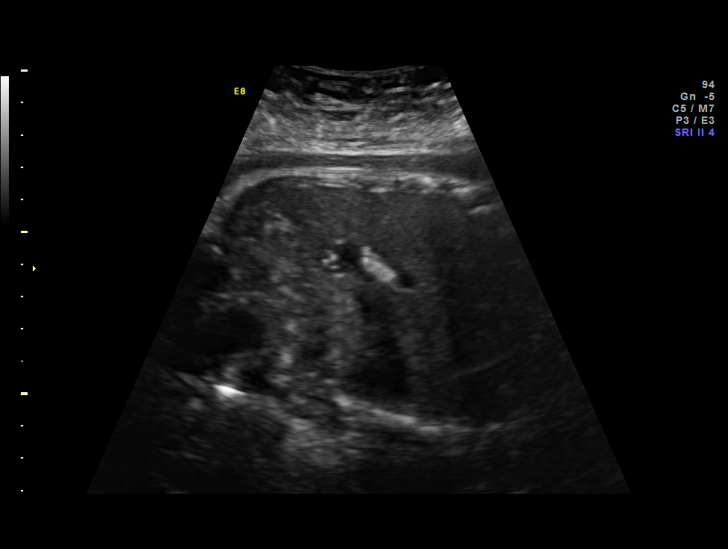

[14 of 28 positions shown; findings below may reference images not displayed]

Canned report from images found in remote index.

Refer to host system for actual result text.

## 2014-04-06 ENCOUNTER — Encounter: Payer: Self-pay | Admitting: Obstetrics and Gynecology

## 2017-11-10 ENCOUNTER — Encounter (HOSPITAL_COMMUNITY): Payer: Self-pay | Admitting: Emergency Medicine

## 2017-11-10 ENCOUNTER — Emergency Department (HOSPITAL_COMMUNITY)
Admission: EM | Admit: 2017-11-10 | Discharge: 2017-11-10 | Disposition: A | Discharge status: Home / Self Care | Payer: Self-pay | Attending: Emergency Medicine | Admitting: Emergency Medicine

## 2017-11-10 DIAGNOSIS — Z79899 Other long term (current) drug therapy: Secondary | ICD-10-CM | POA: Insufficient documentation

## 2017-11-10 DIAGNOSIS — B309 Viral conjunctivitis, unspecified: Secondary | ICD-10-CM

## 2017-11-10 DIAGNOSIS — H1032 Unspecified acute conjunctivitis, left eye: Secondary | ICD-10-CM | POA: Insufficient documentation

## 2017-11-10 DIAGNOSIS — B349 Viral infection, unspecified: Secondary | ICD-10-CM

## 2017-11-10 MED ORDER — PHENOL 1.4 % MT LIQD: 1.0000 | OROMUCOSAL | 0 refills | Status: AC | PRN

## 2017-11-10 NOTE — ED Provider Notes (Signed)
[facility]-EMERGENCY DEPT Provider Note   CSN: [id number] Arrival date & time: 11/10/17  0945     History   Chief Complaint Chief Complaint  Patient presents with  . Eye Pain  . Otalgia  . Sore Throat    HPI Meredith Carr is a 37 y.o. female presenting for evaluation of left eye redness, ear pain, and sore throat.  Patient states that when she woke up this morning, she had left eye irritation and redness.  She has associated left ear pain and sore throat.  She denies fevers, chills, eye pain, difficulty moving her eyes, nasal congestion, cough, chest pain, shortness of breath, nausea, vomiting, abdominal pain.  She has no medical problems, takes no medications daily.  She has not taken anything for her symptoms.  She denies sick contacts.  She states she rubs her eyes frequently.  She does not wear glasses or contacts.  No vision changes or photophobia.  HPI  Past Medical History:  Diagnosis Date  . No pertinent past medical history   . Obesity     Patient Active Problem List   Diagnosis Date Noted  . Obesity 09/26/2011    Past Surgical History:  Procedure Laterality Date  . DILATION AND CURETTAGE OF UTERUS    . NO PAST SURGERIES       OB History    Gravida  3   Para  2   Term  2   Preterm  0   AB  1   Living  2     SAB  1   TAB  0   Ectopic  0   Multiple  0   Live Births  2            Home Medications    Prior to Admission medications   Medication Sig Start Date End Date Taking? Authorizing Provider  levonorgestrel (MIRENA) 20 MCG/24HR IUD 1 each by Intrauterine route once.    [provider]  phenol (CHLORASEPTIC) 1.4 % LIQD Use as directed 1 spray in the mouth or throat as needed for throat irritation / pain. 11/10/17   Keana Dueitt, PA-C  Prenatal Vit-Fe Fumarate-FA (PRENATAL MULTIVITAMIN) TABS Take 1 tablet by mouth daily.    [provider]    Family History Family History  Problem  Relation Age of Onset  . Diabetes Father   . Hypertension Father   . Hypertension Mother   . Anesthesia problems Neg Hx     Social History Social History   Tobacco Use  . Smoking status: Never Smoker  . Smokeless tobacco: Never Used  Substance Use Topics  . Alcohol use: Yes    Comment: occasional   . Drug use: No     Allergies   Patient has no known allergies.   Review of Systems Review of Systems  Constitutional: Negative for fever.  HENT: Positive for ear pain and sore throat.   Eyes: Positive for redness and itching. Negative for photophobia, pain, discharge and visual disturbance.  Respiratory: Negative for cough and shortness of breath.   Cardiovascular: Negative for chest pain.  Gastrointestinal: Negative for abdominal pain.     Physical Exam Updated Vital Signs BP (!) 144/86 (BP Location: Left Arm)   Pulse 91   Temp 98.1 F (36.7 C) (Oral)   Resp 18   LMP 11/08/2017   SpO2 100%   Physical Exam  Constitutional: She is oriented to person, place, and time. She appears well-developed and well-nourished. No distress.  HENT:  Head: Normocephalic and atraumatic.  Right Ear: Tympanic membrane, external ear and ear canal normal.  Left Ear: Tympanic membrane, external ear and ear canal normal.  Nose: Mucosal edema present. Right sinus exhibits no maxillary sinus tenderness and no frontal sinus tenderness. Left sinus exhibits no maxillary sinus tenderness and no frontal sinus tenderness.  Mouth/Throat: Uvula is midline, oropharynx is clear and moist and mucous membranes are normal. No tonsillar exudate.  Preauricular lymphadenopathy on the left side.  Mild nasal mucosal edema.  OP clear without tonsillar swelling or exudate.  Uvula midline with equal palate rise.  TMs nonerythematous and not bulging bilaterally.  Eyes: Pupils are equal, round, and reactive to light. EOM are normal. Right eye exhibits no chemosis, no discharge and no exudate. Left eye exhibits  chemosis. Left eye exhibits no discharge and no exudate. Right conjunctiva is not injected. Left conjunctiva is injected.  EOMI and PERRLA.  Injection of the left conjunctiva with mild chemosis.   Neck: Normal range of motion.  Cardiovascular: Normal rate, regular rhythm and intact distal pulses.  Pulmonary/Chest: Effort normal and breath sounds normal. She has no decreased breath sounds. She has no wheezes. She has no rhonchi. She has no rales.  Pt speaking in full sentences without difficulty.  Clear lung sounds in all fields  Abdominal: Soft. She exhibits no distension. There is no tenderness.  Musculoskeletal: Normal range of motion.  Lymphadenopathy:    She has no cervical adenopathy.  Neurological: She is alert and oriented to person, place, and time.  Skin: Skin is warm.  Psychiatric: She has a normal mood and affect.  Nursing note and vitals reviewed.    ED Treatments / Results  Labs (all labs ordered are listed, but only abnormal results are displayed) Labs Reviewed - No data to display  EKG None  Radiology No results found.  Procedures Procedures (including critical care time)  Medications Ordered in ED Medications - No data to display   Initial Impression / Assessment and Plan / ED Course  I have reviewed the triage vital signs and the nursing notes.  Pertinent labs & imaging results that were available during my care of the patient were reviewed by me and considered in my medical decision making (see chart for details).     Patient presenting for evaluation of eye redness, irritation, and sore throat.  Physical exam shows patient who is afebrile not tachycardic.  She appears nontoxic.  Eye exam shows conjunctival injection without significant periorbital swelling, difficulty with a EOMI, or abnormal pupillary response.  Associated preauricular lymphadenopathy and sore throat.  Likely viral.  No significant discharge, doubt bacterial conjunctivitis.  Discussed  symptomatic treatment and follow-up with ophthalmology as needed.  Doubt acute angle glaucoma, orbital cellulitis, herpetic keratitis, or GCA. At this time, pt appears safe for d/c. Return precautions given. Pt states she understands and agrees to plan.   Final Clinical Impressions(s) / ED Diagnoses   Final diagnoses:  Acute viral conjunctivitis of left eye  Viral illness    ED Discharge Orders        Ordered    phenol (CHLORASEPTIC) 1.4 % LIQD  As needed     11/10/17 1038       Keeshia Sanderlin, PA-C 11/10/17 1237    Azalia Bilisampos, Kevin, MD 11/10/17 1536

## 2017-11-10 NOTE — ED Triage Notes (Signed)
Pt c/o left eye being red this morning with burning and little blurred vision. Reports clear, watery drainage.  Also c/o left ear pain and sore throat.

## 2017-11-10 NOTE — Discharge Instructions (Signed)
Use sore throat spray as needed.  Use lubricating eye drops as needed for eye irritation.  Take aleve as needed for pain or swelling. You may take 2 pills twice a day with food.  Stay well hydrated with water.  Try not to touch your eye, and if you do, make sure to wash your hands immediately afterwards.  Follow up with the eye doctor early next week if your symptoms are not improving.  Return to the ER if you develop vision loss, high fevers, inability to move your eye, or any new or concerning symptoms.

## 2017-11-12 ENCOUNTER — Encounter (HOSPITAL_COMMUNITY): Payer: Self-pay | Admitting: Emergency Medicine

## 2017-11-12 ENCOUNTER — Emergency Department (HOSPITAL_COMMUNITY)
Admission: EM | Admit: 2017-11-12 | Discharge: 2017-11-12 | Disposition: A | Discharge status: Home / Self Care | Payer: Self-pay | Attending: Emergency Medicine | Admitting: Emergency Medicine

## 2017-11-12 DIAGNOSIS — H1032 Unspecified acute conjunctivitis, left eye: Secondary | ICD-10-CM | POA: Insufficient documentation

## 2017-11-12 DIAGNOSIS — J069 Acute upper respiratory infection, unspecified: Secondary | ICD-10-CM | POA: Insufficient documentation

## 2017-11-12 DIAGNOSIS — Z79899 Other long term (current) drug therapy: Secondary | ICD-10-CM | POA: Insufficient documentation

## 2017-11-12 DIAGNOSIS — H109 Unspecified conjunctivitis: Secondary | ICD-10-CM

## 2017-11-12 MED ORDER — POLYMYXIN B-TRIMETHOPRIM 10000-0.1 UNIT/ML-% OP SOLN: 1.0000 [drp] | OPHTHALMIC | 0 refills | Status: AC

## 2017-11-12 NOTE — ED Provider Notes (Signed)
[facility]-EMERGENCY DEPT Provider Note   CSN: [id number] Arrival date & time: 11/12/17  1104     History   Chief Complaint Chief Complaint  Patient presents with  . Eye Pain  . Otalgia  . Sore Throat    HPI Amanda PeaLoretta Massenburg is a 37 y.o. female.  Amanda PeaLoretta Roehrs is a 37 y.o. Female who is otherwise healthy, presents to the emergency department for evaluation of 3 days of nasal congestion and rhinorrhea, bilateral ear pain, sore throat and redness irritation and pain in her left eye.  Patient was seen on Saturday for the same symptoms, told this was likely a virus and viral conjunctivitis, and that the symptoms would slowly improve on their own, discharged with symptomatic treatment.  Patient presents today due to worsening of symptoms.  Patient reports her left eye is persistently red and irritated with watery drainage, she reports the drops and compresses are not helping much and she feels this is gotten slightly worse although swelling around her eye has improved.  She reports nasal congestion and rhinorrhea has been worsening.  She has been using Aleve, artificial tears eyedrops and Chloraseptic throat spray has not been taking anything for nasal congestion.  She reports she was initially just having pain and pressure in her left ear she now has pain and pressure sensation bilaterally, denies any discharge or drainage from the ears, no change in hearing.  She reports persistent sore throat, no difficulty breathing or swallowing or tolerating fluids.  She denies any cough.  No chest pain or shortness of breath.  No nausea, vomiting, or abdominal pain.     Past Medical History:  Diagnosis Date  . No pertinent past medical history   . Obesity     Patient Active Problem List   Diagnosis Date Noted  . Obesity 09/26/2011    Past Surgical History:  Procedure Laterality Date  . DILATION AND CURETTAGE OF UTERUS    . NO PAST SURGERIES       OB History    Gravida   3   Para  2   Term  2   Preterm  0   AB  1   Living  2     SAB  1   TAB  0   Ectopic  0   Multiple  0   Live Births  2            Home Medications    Prior to Admission medications   Medication Sig Start Date End Date Taking? Authorizing Provider  levonorgestrel (MIRENA) 20 MCG/24HR IUD 1 each by Intrauterine route once.    [provider]  phenol (CHLORASEPTIC) 1.4 % LIQD Use as directed 1 spray in the mouth or throat as needed for throat irritation / pain. 11/10/17   Caccavale, Sophia, PA-C  Prenatal Vit-Fe Fumarate-FA (PRENATAL MULTIVITAMIN) TABS Take 1 tablet by mouth daily.    [provider]  trimethoprim-polymyxin b (POLYTRIM) ophthalmic solution Place 1 drop into the left eye every 4 (four) hours. 11/12/17   Dartha LodgeFord, Kelsey N, PA-C    Family History Family History  Problem Relation Age of Onset  . Diabetes Father   . Hypertension Father   . Hypertension Mother   . Anesthesia problems Neg Hx     Social History Social History   Tobacco Use  . Smoking status: Never Smoker  . Smokeless tobacco: Never Used  Substance Use Topics  . Alcohol use: Yes    Comment: occasional   .  Drug use: No     Allergies   Patient has no known allergies.   Review of Systems Review of Systems  Constitutional: Negative for chills and fever.  HENT: Positive for congestion, ear pain, postnasal drip, rhinorrhea, sinus pressure and sore throat. Negative for ear discharge.   Eyes: Positive for pain, discharge, redness and itching. Negative for photophobia and visual disturbance.  Respiratory: Negative for cough, chest tightness, shortness of breath and wheezing.   Cardiovascular: Negative for chest pain.  Gastrointestinal: Negative for abdominal pain, diarrhea, nausea and vomiting.  Musculoskeletal: Negative for arthralgias, myalgias, neck pain and neck stiffness.  Skin: Negative for color change and rash.  Neurological: Positive for headaches. Negative  for dizziness, syncope and light-headedness.     Physical Exam Updated Vital Signs BP (!) 145/82 (BP Location: Right Arm)   Pulse 88   Temp 98.3 F (36.8 C) (Oral)   Resp 18   Ht 5\' 7"  (1.702 m)   Wt (!) 161.5 kg (356 lb)   LMP 11/08/2017   SpO2 99%   BMI 55.76 kg/m   Physical Exam  Constitutional: She appears well-developed and well-nourished. No distress.  HENT:  Head: Normocephalic and atraumatic.  Right Ear: Tympanic membrane normal.  Left Ear: Tympanic membrane normal.  TMs clear with good landmarks, moderate nasal mucosa edema with clear rhinorrhea, posterior oropharynx clear and moist, with some erythema, no edema or exudates, uvula midline  Eyes: Right eye exhibits no discharge. Left eye exhibits no discharge.  Neck: Neck supple.  Mild cervical lymphadenopathy, no stridor, no rigidity  Cardiovascular: Normal rate, regular rhythm, normal heart sounds and intact distal pulses.  Pulmonary/Chest: Effort normal and breath sounds normal. No stridor. No respiratory distress. She has no wheezes. She has no rales.  Respirations equal and unlabored, patient able to speak in full sentences, lungs clear to auscultation bilaterally  Abdominal: Soft. Bowel sounds are normal. She exhibits no distension and no mass. There is no tenderness. There is no guarding.  Abdomen soft, nondistended, nontender to palpation in all quadrants without guarding or peritoneal signs  Musculoskeletal: She exhibits no edema or deformity.  Neurological: She is alert. Coordination normal.  Skin: Skin is warm and dry. Capillary refill takes less than 2 seconds. She is not diaphoretic.  Psychiatric: She has a normal mood and affect. Her behavior is normal.  Nursing note and vitals reviewed.    ED Treatments / Results  Labs (all labs ordered are listed, but only abnormal results are displayed) Labs Reviewed - No data to display  EKG None  Radiology No results found.  Procedures Procedures  (including critical care time)  Medications Ordered in ED Medications - No data to display   Initial Impression / Assessment and Plan / ED Course  I have reviewed the triage vital signs and the nursing notes.  Pertinent labs & imaging results that were available during my care of the patient were reviewed by me and considered in my medical decision making (see chart for details).  Pt presents with nasal congestion sore throat and redness and irritation of the left eye, patient seen on Saturday for the same symptoms, returns as they are not improving. Pt is well appearing and vitals are normal. Lungs CTA on exam.  Patient is not having any cough, pneumonia is unlikely.  Patients symptoms are consistent with URI, likely viral etiology, discussed with patient that these can commonly last for 5 to 7 days before symptoms start to improve.  Patient very concerned that  conjunctivitis has not improved at all, she continues to complain of redness and irritation of the eye with intermittent discharge, will prescribe Polytrim drops, discussed symptomatic treatment as well although feel given patient's constellation of symptoms this is likely viral.  Pt will be discharged with symptomatic treatment.  Verbalizes understanding and is agreeable with plan. Pt is hemodynamically stable & in NAD prior to dc.  Final Clinical Impressions(s) / ED Diagnoses   Final diagnoses:  Viral upper respiratory tract infection  Conjunctivitis of left eye, unspecified conjunctivitis type    ED Discharge Orders        Ordered    trimethoprim-polymyxin b (POLYTRIM) ophthalmic solution  Every 4 hours     11/12/17 1315       Dartha Lodge, [place] 11/12/17 1333    Wynetta Fines, MD 11/14/17 1445

## 2017-11-12 NOTE — Discharge Instructions (Addendum)
Your symptoms are likely caused by a viral upper respiratory infection. Antibiotics are not helpful in treating viral infection, the virus should run its course in about 5-7 days. Please make sure you are drinking plenty of fluids. You can treat your symptoms supportively with tylenol/aleve for fevers and pains, Zyrtec, Flonase and pseudoephedrine with nasal congestion, and over the counter cough syrups such as mucinex and throat lozenges to help with cough. If your symptoms are not improving please follow up with you Primary doctor. Your conjunctivitis is likely viral but since it has not improved at all you may start using Polytrim drops as directed.  Please continue with soothing eyedrops at home as well you can put these in the refrigerator to provide more relief.  If you develop persistent fevers, shortness of breath or difficulty breathing, chest pain, severe headache and neck pain, persistent nausea and vomiting or other new or concerning symptoms return to the Emergency department.

## 2017-11-12 NOTE — ED Triage Notes (Signed)
Per pt, states she was here on Saturday for the same symptoms-states she was told she has viral pink eye and wasn't given meds-was given spray for sore throat-states now both ears hurt and her other symptoms are not getting any better

## 2024-03-21 ENCOUNTER — Other Ambulatory Visit (HOSPITAL_BASED_OUTPATIENT_CLINIC_OR_DEPARTMENT_OTHER): Payer: Self-pay

## 2024-03-21 MED ORDER — WEGOVY 0.25 MG/0.5ML ~~LOC~~ SOAJ
0.2500 mg | SUBCUTANEOUS | 1 refills | Status: DC
  Filled 2024-03-21: qty 2, 28d supply, fill #0

## 2024-03-24 ENCOUNTER — Other Ambulatory Visit (HOSPITAL_BASED_OUTPATIENT_CLINIC_OR_DEPARTMENT_OTHER): Payer: Self-pay

## 2024-03-24 MED ORDER — VITAMIN D (ERGOCALCIFEROL) 50000 UNITS PO CAPS
50000.0000 [IU] | ORAL_CAPSULE | ORAL | 1 refills | Status: AC
  Filled 2024-03-24: qty 8, 28d supply, fill #0

## 2024-03-24 MED ORDER — ZEPBOUND 2.5 MG/0.5ML ~~LOC~~ SOAJ
2.5000 mg | SUBCUTANEOUS | 1 refills | Status: DC
  Filled 2024-03-24: qty 2, 28d supply, fill #0

## 2024-03-24 MED ORDER — HYDROCHLOROTHIAZIDE 12.5 MG PO TABS
12.5000 mg | ORAL_TABLET | Freq: Every day | ORAL | 6 refills | Status: AC
  Filled 2024-03-24: qty 31, 31d supply, fill #0

## 2024-03-26 ENCOUNTER — Other Ambulatory Visit (HOSPITAL_BASED_OUTPATIENT_CLINIC_OR_DEPARTMENT_OTHER): Payer: Self-pay

## 2024-03-26 MED ORDER — PHENTERMINE HCL 37.5 MG PO TABS
37.5000 mg | ORAL_TABLET | Freq: Every day | ORAL | 1 refills | Status: AC
  Filled 2024-03-26: qty 30, 30d supply, fill #0
